# Patient Record
Sex: Female | Born: 1960 | Race: White | Hispanic: Yes | Smoking: Current every day smoker
Health system: Southern US, Community
[De-identification: ages and names within clinical notes are randomized; demographics above are authoritative.]

## PROBLEM LIST (undated history)

## (undated) DIAGNOSIS — Z789 Other specified health status: Secondary | ICD-10-CM

## (undated) DIAGNOSIS — K8010 Calculus of gallbladder with chronic cholecystitis without obstruction: Secondary | ICD-10-CM

## (undated) HISTORY — PX: TUBAL LIGATION: SHX77

## (undated) HISTORY — PX: KNEE SURGERY: SHX244

---

## 2015-02-27 ENCOUNTER — Encounter (HOSPITAL_COMMUNITY): Payer: Self-pay

## 2015-02-27 ENCOUNTER — Emergency Department (HOSPITAL_COMMUNITY): Payer: Self-pay

## 2015-02-27 ENCOUNTER — Emergency Department (HOSPITAL_COMMUNITY)
Admission: EM | Admit: 2015-02-27 | Discharge: 2015-02-27 | Disposition: A | Discharge status: Home / Self Care | Payer: Self-pay | Attending: Physician Assistant | Admitting: Physician Assistant

## 2015-02-27 DIAGNOSIS — K819 Cholecystitis, unspecified: Secondary | ICD-10-CM | POA: Insufficient documentation

## 2015-02-27 DIAGNOSIS — Z3202 Encounter for pregnancy test, result negative: Secondary | ICD-10-CM | POA: Insufficient documentation

## 2015-02-27 DIAGNOSIS — N939 Abnormal uterine and vaginal bleeding, unspecified: Secondary | ICD-10-CM | POA: Insufficient documentation

## 2015-02-27 DIAGNOSIS — F17200 Nicotine dependence, unspecified, uncomplicated: Secondary | ICD-10-CM | POA: Insufficient documentation

## 2015-02-27 LAB — COMPREHENSIVE METABOLIC PANEL
ALK PHOS: 74 U/L (ref 38–126)
ALT: 21 U/L (ref 14–54)
AST: 23 U/L (ref 15–41)
Albumin: 3.9 g/dL (ref 3.5–5.0)
Anion gap: 8 (ref 5–15)
BUN: 9 mg/dL (ref 6–20)
CALCIUM: 9.2 mg/dL (ref 8.9–10.3)
CHLORIDE: 105 mmol/L (ref 101–111)
CO2: 24 mmol/L (ref 22–32)
CREATININE: 0.54 mg/dL (ref 0.44–1.00)
Glucose, Bld: 132 mg/dL — ABNORMAL HIGH (ref 65–99)
Potassium: 3.8 mmol/L (ref 3.5–5.1)
Sodium: 137 mmol/L (ref 135–145)
Total Bilirubin: 0.4 mg/dL (ref 0.3–1.2)
Total Protein: 7.2 g/dL (ref 6.5–8.1)

## 2015-02-27 LAB — URINALYSIS, ROUTINE W REFLEX MICROSCOPIC
BILIRUBIN URINE: NEGATIVE
Glucose, UA: NEGATIVE mg/dL
Ketones, ur: NEGATIVE mg/dL
Nitrite: NEGATIVE
PROTEIN: NEGATIVE mg/dL
Specific Gravity, Urine: 1.009 (ref 1.005–1.030)
pH: 6 (ref 5.0–8.0)

## 2015-02-27 LAB — URINE MICROSCOPIC-ADD ON

## 2015-02-27 LAB — CBC WITH DIFFERENTIAL/PLATELET
Basophils Absolute: 0 10*3/uL (ref 0.0–0.1)
Basophils Relative: 0 %
EOS PCT: 1 %
Eosinophils Absolute: 0 10*3/uL (ref 0.0–0.7)
HCT: 38.5 % (ref 36.0–46.0)
Hemoglobin: 13.3 g/dL (ref 12.0–15.0)
LYMPHS ABS: 1.8 10*3/uL (ref 0.7–4.0)
LYMPHS PCT: 29 %
MCH: 29.5 pg (ref 26.0–34.0)
MCHC: 34.5 g/dL (ref 30.0–36.0)
MCV: 85.4 fL (ref 78.0–100.0)
MONO ABS: 0.8 10*3/uL (ref 0.1–1.0)
MONOS PCT: 12 %
Neutro Abs: 3.8 10*3/uL (ref 1.7–7.7)
Neutrophils Relative %: 58 %
PLATELETS: 203 10*3/uL (ref 150–400)
RBC: 4.51 MIL/uL (ref 3.87–5.11)
RDW: 12.6 % (ref 11.5–15.5)
WBC: 6.5 10*3/uL (ref 4.0–10.5)

## 2015-02-27 LAB — LIPASE, BLOOD: LIPASE: 29 U/L (ref 11–51)

## 2015-02-27 LAB — POC URINE PREG, ED: PREG TEST UR: NEGATIVE

## 2015-02-27 MED ORDER — ONDANSETRON HCL 4 MG/2ML IJ SOLN
4.0000 mg | Freq: Once | INTRAMUSCULAR | Status: AC
  Administered 2015-02-27: 4 mg via INTRAVENOUS
  Filled 2015-02-27: qty 2

## 2015-02-27 MED ORDER — SODIUM CHLORIDE 0.9 % IV BOLUS (SEPSIS)
1000.0000 mL | Freq: Once | INTRAVENOUS | Status: AC
  Administered 2015-02-27: 1000 mL via INTRAVENOUS

## 2015-02-27 MED ORDER — OXYCODONE-ACETAMINOPHEN 5-325 MG PO TABS: 2.0000 | ORAL_TABLET | ORAL | Status: DC | PRN

## 2015-02-27 MED ORDER — MORPHINE SULFATE (PF) 4 MG/ML IV SOLN
4.0000 mg | Freq: Once | INTRAVENOUS | Status: AC
  Administered 2015-02-27: 4 mg via INTRAVENOUS
  Filled 2015-02-27: qty 1

## 2015-02-27 MED ORDER — ONDANSETRON HCL 4 MG PO TABS: 4.0000 mg | ORAL_TABLET | Freq: Four times a day (QID) | ORAL | Status: AC

## 2015-02-27 NOTE — ED Provider Notes (Signed)
CSN: [id number]     Arrival date & time 02/27/15  1053 History   First MD Initiated Contact with Patient 02/27/15 1133     Chief Complaint  Patient presents with  . Flank Pain   (Consider location/radiation/quality/duration/timing/severity/associated sxs/prior Treatment) Patient is a 54 y.o. female presenting with flank pain. The history is provided by the patient and a relative. No language interpreter was used.  Flank Pain Associated symptoms include abdominal pain, nausea and vomiting. Pertinent negatives include no fever.  [name] is a 54 year old female with no significant past medical history who presents complaining of right flank pain and right upper quadrant abdominal pain since yesterday. She is also reporting decreased urine output for the past week. She vomited 4 times yesterday and once this morning. She has had normal bowel movements and her last BM was yesterday. She denies any previous abdominal surgeries. She also reports that she is on her period for over a month. [place] the country in one [place] ago. She has no established PCP. She felt feverish yesterday but did not take her temperature. She denies any dizziness, chills, chest pain, shortness of breath, diarrhea, constipation.    History reviewed. No pertinent past medical history. Past Surgical History  Procedure Laterality Date  . Knee surgery     No family history on file. Social History  Substance Use Topics  . Smoking status: Current Every Day Smoker  . Smokeless tobacco: None  . Alcohol Use: No   OB History    No data available     Review of Systems  Constitutional: Negative for fever.  Gastrointestinal: Positive for nausea, vomiting and abdominal pain. Negative for diarrhea, constipation and blood in stool.  Genitourinary: Positive for flank pain.  All other systems reviewed and are negative.     Allergies  Review of patient's allergies indicates no known allergies.  Home Medications    Prior to Admission medications   Medication Sig Start Date End Date Taking? Authorizing Provider  ibuprofen (ADVIL,MOTRIN) 200 MG tablet Take 400 mg by mouth every 6 (six) hours as needed for headache, mild pain or moderate pain.   Yes Historical Provider, MD  ondansetron (ZOFRAN) 4 MG tablet Take 1 tablet (4 mg total) by mouth every 6 (six) hours. 02/27/15   Hillery Bhalla Patel-Mills, PA-C  oxyCODONE-acetaminophen (PERCOCET/ROXICET) 5-325 MG tablet Take 2 tablets by mouth every 4 (four) hours as needed for severe pain. 02/27/15   Doyle Kunath Patel-Mills, PA-C   BP 116/89 mmHg  Pulse 75  Temp(Src) 98.7 F (37.1 C) (Oral)  Resp 16  SpO2 98%  LMP 01/28/2015 (Exact Date) Physical Exam  Constitutional: She is oriented to person, place, and time. She appears well-developed and well-nourished.  HENT:  Head: Normocephalic and atraumatic.  Eyes: Conjunctivae are normal.  No jaundice.  Neck: Normal range of motion. Neck supple.  Cardiovascular: Normal rate, regular rhythm and normal heart sounds.   Pulmonary/Chest: Effort normal and breath sounds normal. No respiratory distress.  Abdominal: Soft. She exhibits no distension. There is tenderness in the right upper quadrant. There is CVA tenderness. There is no rebound and no guarding.    Right flank and right CVA tenderness to palpation. No guarding or rebound. No abdominal distention.  Genitourinary:  Pelvic exam: Chaperone present, moderate amount of vaginal bleeding from the cervical os. No adnexal tenderness.  Musculoskeletal: Normal range of motion.  Neurological: She is alert and oriented to person, place, and time.  Skin: Skin is warm and dry.  Nursing  note and vitals reviewed.   ED Course  Procedures (including critical care time) Labs Review Labs Reviewed  COMPREHENSIVE METABOLIC PANEL - Abnormal; Notable for the following:    Glucose, Bld 132 (*)    All other components within normal limits  URINALYSIS, ROUTINE W REFLEX MICROSCOPIC (NOT  AT [facility]) - Abnormal; Notable for the following:    APPearance CLOUDY (*)    Hgb urine dipstick LARGE (*)    Leukocytes, UA SMALL (*)    All other components within normal limits  URINE MICROSCOPIC-ADD ON - Abnormal; Notable for the following:    Squamous Epithelial / LPF 0-5 (*)    Bacteria, UA FEW (*)    All other components within normal limits  CBC WITH DIFFERENTIAL/PLATELET  LIPASE, BLOOD  POC URINE PREG, ED    Imaging Review US Abdomen Complete  02/27/2015  CLINICAL DATA:  54 year old female with right abdominal and flank pain EXAM: ABDOMEN ULTRASOUND COMPLETE COMPARISON:  None. FINDINGS: Gallbladder: Echogenic mobile foci with posterior acoustic shadowing consistent with cholelithiasis. The gallbladder wall is thickened at 6 mm. Per the sonographer, there is no sonographic Murphy sign. Common bile duct: Diameter: Within normal limits at 5 mm Liver: No focal lesion identified. Within normal limits in parenchymal echogenicity. IVC: No abnormality visualized. Pancreas: Visualized portion unremarkable. Spleen: Size and appearance within normal limits. Right Kidney: Length: 10.3 cm. Echogenicity within normal limits. No mass or hydronephrosis visualized. Left Kidney: Length: 11.2 cm. Echogenicity within normal limits. No mass or hydronephrosis visualized. Abdominal aorta: No aneurysm visualized. Other findings: None. IMPRESSION: 1. Cholelithiasis with diffuse gallbladder wall thickening. However, there is no pericholecystic fluid and per the sonographer, the sonographic Murphy sign was negative. Sonographic findings are therefore indeterminate but can be seen in the setting of both chronic and acute cholecystitis. If further imaging is clinically warranted, consider nuclear medicine HIDA scan. 2. Normal caliber common bile ducts. Electronically Signed   By: Malachy Moan M.D.   On: 02/27/2015 14:56   I have personally reviewed and evaluated these images and lab results as part of my medical  decision-making.   EKG Interpretation None      MDM   Final diagnoses:  Cholecystitis  Vaginal bleeding  Patient presents for RUQ abdominal pain and decreased urine output.  She also complains of being on her menstrual cycle for a month. Her exam shows moderate amount of vaginal bleeding but her hemoglobin is stable. She is well-appearing and has no complaints of dizziness or shortness of breath. He was given women's outpatient clinic for follow-up for vaginal bleeding. He was able to urinate in the ED without difficulty. She does not have a urinary tract infection and is not pregnant. Her labs are unremarkable. Due to right upper quadrant abdominal pain a ultrasound was ordered. Ultrasound shows acute cholelithiasis but no gallbladder wall thickening or dilation of common bile duct. I do not believe this is acute cholangitis the patient is afebrile and without tenderness. I discussed findings with her son. I also explained it would need to follow-up with Gen. surgery. Return precautions were discussed. She was prescribed Zofran and Percocet. Patient and son and agreeable to plan.  Medications  sodium chloride 0.9 % bolus 1,000 mL (0 mLs Intravenous Stopped 02/27/15 1401)  morphine 4 MG/ML injection 4 mg (4 mg Intravenous Given 02/27/15 1233)  ondansetron (ZOFRAN) injection 4 mg (4 mg Intravenous Given 02/27/15 1233)   Filed Vitals:   02/27/15 1121 02/27/15 1532  BP: 110/59 116/89  Pulse: 75  75  Temp: 98.1 F (36.7 C) 98.7 F (37.1 C)  Resp: 16 [id number]      Tequia Wolman Patel-Mills, PA-C 02/28/15 1143  Courteney Randall AnLyn Mackuen, MD 02/28/15 1516

## 2015-02-27 NOTE — ED Notes (Signed)
Ultrasound Bedside.

## 2015-02-27 NOTE — ED Notes (Signed)
She c/o  Right flank pain with urinary hesitancy x 1 week.  It got worse yesterday and she vomited a few times; and also vomited x 1 this morning.  She is in no distress.

## 2015-03-18 ENCOUNTER — Other Ambulatory Visit: Payer: Self-pay | Admitting: General Surgery

## 2015-04-29 ENCOUNTER — Encounter (HOSPITAL_COMMUNITY)
Admission: RE | Admit: 2015-04-29 | Discharge: 2015-04-29 | Disposition: A | Discharge status: Home / Self Care | Payer: Self-pay | Source: Ambulatory Visit | Attending: General Surgery | Admitting: General Surgery

## 2015-04-29 ENCOUNTER — Encounter (HOSPITAL_COMMUNITY): Payer: Self-pay

## 2015-04-29 DIAGNOSIS — K8010 Calculus of gallbladder with chronic cholecystitis without obstruction: Secondary | ICD-10-CM | POA: Insufficient documentation

## 2015-04-29 DIAGNOSIS — Z01812 Encounter for preprocedural laboratory examination: Secondary | ICD-10-CM | POA: Insufficient documentation

## 2015-04-29 HISTORY — DX: Other specified health status: Z78.9

## 2015-04-29 LAB — CBC WITH DIFFERENTIAL/PLATELET
BASOS PCT: 0 %
Basophils Absolute: 0 10*3/uL (ref 0.0–0.1)
EOS ABS: 0.3 10*3/uL (ref 0.0–0.7)
EOS PCT: 4 %
HCT: 41.5 % (ref 36.0–46.0)
HEMOGLOBIN: 14.1 g/dL (ref 12.0–15.0)
LYMPHS ABS: 3.3 10*3/uL (ref 0.7–4.0)
Lymphocytes Relative: 43 %
MCH: 29.6 pg (ref 26.0–34.0)
MCHC: 34 g/dL (ref 30.0–36.0)
MCV: 87.2 fL (ref 78.0–100.0)
Monocytes Absolute: 0.5 10*3/uL (ref 0.1–1.0)
Monocytes Relative: 7 %
NEUTROS PCT: 46 %
Neutro Abs: 3.6 10*3/uL (ref 1.7–7.7)
PLATELETS: 225 10*3/uL (ref 150–400)
RBC: 4.76 MIL/uL (ref 3.87–5.11)
RDW: 12.9 % (ref 11.5–15.5)
WBC: 7.8 10*3/uL (ref 4.0–10.5)

## 2015-04-29 LAB — COMPREHENSIVE METABOLIC PANEL
ALBUMIN: 4 g/dL (ref 3.5–5.0)
ALK PHOS: 89 U/L (ref 38–126)
ALT: 19 U/L (ref 14–54)
ANION GAP: 14 (ref 5–15)
AST: 16 U/L (ref 15–41)
BUN: 18 mg/dL (ref 6–20)
CALCIUM: 9.7 mg/dL (ref 8.9–10.3)
CHLORIDE: 106 mmol/L (ref 101–111)
CO2: 21 mmol/L — AB (ref 22–32)
CREATININE: 0.53 mg/dL (ref 0.44–1.00)
GFR calc Af Amer: >60 mL/min (ref >60)
GFR calc non Af Amer: >60 mL/min (ref >60)
GLUCOSE: 106 mg/dL — AB (ref 65–99)
Potassium: 4.3 mmol/L (ref 3.5–5.1)
SODIUM: 141 mmol/L (ref 135–145)
Total Bilirubin: 0.1 mg/dL — ABNORMAL LOW (ref 0.3–1.2)
Total Protein: 6.9 g/dL (ref 6.5–8.1)

## 2015-04-29 NOTE — Pre-Procedure Instructions (Signed)
    Freddy Spadafora  04/29/2015      [facility] [id number] [address],  - [address] AT [facility] OF [facility] & [address] [place] [address] [place] [address] Phone: [phone] Fax: [phone]    Your procedure is scheduled on 05/04/15.  Report to [facility] Admitting at 530 A.M.  Call this number if you have problems the morning of surgery:  [phone]   Remember:  Do not eat food or drink liquids after midnight.  Take these medicines the morning of surgery with A SIP OF WATER --pain medication   Do not wear jewelry, make-up or nail polish.  Do not wear lotions, powders, or perfumes.  You may wear deodorant.  Do not shave 48 hours prior to surgery.  Men may shave face and neck.  Do not bring valuables to the hospital.  [facility] is not responsible for any belongings or valuables.  Contacts, dentures or bridgework may not be worn into surgery.  Leave your suitcase in the car.  After surgery it may be brought to your room.  For patients admitted to the hospital, discharge time will be determined by your treatment team.  Patients discharged the day of surgery will not be allowed to drive home.   Name and phone number of your driver:    Special instructions:   Please read over the following fact sheets that you were given. Pain Booklet, Coughing and Deep Breathing and Surgical Site Infection Prevention

## 2015-05-03 MED ORDER — CEFAZOLIN SODIUM-DEXTROSE 2-3 GM-% IV SOLR
2.0000 g | INTRAVENOUS | Status: AC
  Administered 2015-05-04: 2 g via INTRAVENOUS
  Filled 2015-05-03: qty 50

## 2015-05-03 NOTE — H&P (Signed)
Katherine Warren   Location: [facility] Surgery Patient #: [id number] DOB: 07/07/1960 Married / Language: English / Race: White Female      History of Present Illness   The patient is a 55 year old female who presents for evaluation of gall stones. This is a very pleasant 55 year old Hispanic woman, referred by Dr. Thomasene Warren in the emergency department for evaluation of symptomatic gallstones.  The patient is here with her daughter who provides fluent translation. The daughter is present throughout the entire encounter. The patient is in no distress today. She gives a one-year history of episodes of right upper quadrant and right flank pain nausea and vomiting. This is intermittent. Denies history of pancreatitis, jaundice, or hepatitis. She had a severe attack before Christmas. She presented to the emergency department with 3 days of right upper quadrant pain. CBC, C met, and lipase were normal. Ultrasound showed multiple gallstones, diffuse gallbladder wall thickening but no other inflammatory changes and a negative Murphy's sign. CBD is normal.  She's feeling fairly well today. Has a little bit of discomfort off and on. Has not vomited since Christmas Eve.  Past history is negative for any abdominal surgery except for a laparoscopic tubal. Family history is negative for gallbladder disease or cancer.  She moved to this country 2 months ago. She is married and her husband works Architect. She lives at home and is unemployed. She has 5 children. Smokes at least a half a pack of cigarettes per day. Denies alcohol.  I went over the Spanish language information booklet with her and gave it to her. She understands the need for surgery. She'll be scheduled for laparoscopic cholecystectomy with cholangiogram, possible open. It was explained to her and she understands that there is a risk of bleeding, infection, conversion to open laparotomy, wound healing problems such as  hernia, bile leak necessitating reoperation, injury to adjacent organs requiring major reconstructive surgery, common bile duct stones requiring further interventions, cardiac pulmonary and thromboembolic problems. She understands all these issues well. All questions are answered. She agrees with this plan.   Anxiety Disorder Back Pain Cholelithiasis Hypercholesterolemia  Past Surgical History  Knee Surgery Right.  Diagnostic Studies History  Mammogram 1-3 years ago Pap Smear 1-5 years ago   Allergies  No Known Dbin Gwynn, RMA; 03/18/2015 1:44 PM)rug Allergies01/02/2016  Medication History  Oxycodone-Acetaminophen (5-325MG Tablet, Oral) Active. Ondansetron HCl (4MG Tablet, Oral) Active.  Alcohol use Remotely quit alcohol use. Caffeine use Carbonated beverages, Coffee. No drug use Tobacco use Current every day smoker.  Family History Alcohol Abuse Brother, Daughter, Sister. Diabetes Mellitus Father, Sister. Kidney Disease Daughter. Migraine Headache Daughter.  Pregnancy / Birth History  Age at menarche 28 years. Gravida 5 Irregular periods Maternal age 1-20 Para 5    Review of Systems  General Not Present- Appetite Loss, Chills, Fatigue, Fever, Night Sweats, Weight Gain and Weight Loss. Skin Not Present- Change in Wart/Mole, Dryness, Hives, Jaundice, New Lesions, Non-Healing Wounds, Rash and Ulcer. HEENT Present- Hearing Loss, Ringing in the Ears, Seasonal Allergies, Visual Disturbances and Wears glasses/contact lenses. Not Present- Earache, Hoarseness, Nose Bleed, Oral Ulcers, Sinus Pain, Sore Throat and Yellow Eyes. Breast Present- Breast Pain. Not Present- Breast Mass, Nipple Discharge and Skin Changes. Cardiovascular Present- Leg Cramps and Swelling of Extremities. Not Present- Chest Pain, Difficulty Breathing Lying Down, Palpitations, Rapid Heart Rate and Shortness of Breath. Gastrointestinal Present- Abdominal Pain, Nausea and Vomiting.  Not Present- Bloating, Bloody Stool, Change in Bowel Habits, Chronic diarrhea, Constipation, Difficulty Swallowing,  Excessive gas, Gets full quickly at meals, Hemorrhoids, Indigestion and Rectal Pain. Musculoskeletal Present- Back Pain, Muscle Pain and Swelling of Extremities. Not Present- Joint Pain, Joint Stiffness and Muscle Weakness. Neurological Present- Decreased Memory, Headaches and Numbness. Not Present- Fainting, Seizures, Tingling, Tremor, Trouble walking and Weakness. Psychiatric Present- Anxiety, Bipolar, Change in Sleep Pattern and Fearful. Not Present- Depression and Frequent crying. Endocrine Present- Excessive Hunger and Heat Intolerance. Not Present- Cold Intolerance, Hair Changes, Hot flashes and New Diabetes. Hematology Present- Excessive bleeding. Not Present- Easy Bruising, Gland problems, HIV and Persistent Infections.  Vitals Weight: 165 lb Height: 60in Body Surface Area: 1.72 m Body Mass Index: 32.22 kg/m  Temp.: 97.44F  Pulse: 81 (Regular)  BP: 124/80 (Sitting, Left Arm, Standard)       History and physical General Mental Status-Alert. General Appearance-Consistent with stated age. Hydration-Well hydrated. Voice-Normal. Note: BMI 32.2   Head and Neck Head-normocephalic, atraumatic with no lesions or palpable masses. Trachea-midline. Thyroid Gland Characteristics - normal size and consistency.  Eye Eyeball - Bilateral-Extraocular movements intact. Sclera/Conjunctiva - Bilateral-No scleral icterus.  Chest and Lung Exam Chest and lung exam reveals -quiet, even and easy respiratory effort with no use of accessory muscles and on auscultation, normal breath sounds, no adventitious sounds and normal vocal resonance. Inspection Chest Wall - Normal. Back - normal.  Cardiovascular Cardiovascular examination reveals -normal heart sounds, regular rate and rhythm with no murmurs and normal pedal pulses  bilaterally.  Abdomen Note: Abdomen is soft. Not distended. Subjectively a little tender in the right upper quadrant but no guarding or mass. Small scar at the umbilicus. No hernias. No enlargement of liver or spleen.   Neurologic Neurologic evaluation reveals -alert and oriented x 3 with no impairment of recent or remote memory. Mental Status-Normal.  Musculoskeletal Normal Exam - Left-Upper Extremity Strength Normal and Lower Extremity Strength Normal. Normal Exam - Right-Upper Extremity Strength Normal and Lower Extremity Strength Normal.  Lymphatic Head & Neck  General Head & Neck Lymphatics: Bilateral - Description - Normal. Axillary  General Axillary Region: Bilateral - Description - Normal. Tenderness - Non Tender. Femoral & Inguinal  Generalized Femoral & Inguinal Lymphatics: Bilateral - Description - Normal. Tenderness - Non Tender.    Assessment & Plan  CHRONIC CHOLECYSTITIS WITH CALCULUS (K80.10) .   Your episodes of abdominal pain nausea and vomiting or due to your multiple gallstones. This pain episodes will continue until you have a gallbladder operation. You have stated that she would like to have this operation. We have discussed the indications, techniques, and numerous risk of this surgery in detail please read the patient information booklet in Spanish that we gave the  Avoid all fatty foods and also spicy foods in the meantime. Try hard to cut back on your cigarette smoking  BMI 32.0-32.9,ADULT (Z68.32) HISTORY OF TUBAL LIGATION (Z98.51) TOBACCO ABUSE (Z72.0)    Sareena Odeh M. Dalbert Batman, M.D., [facility] General and Minimally invasive Surgery Breast and Colorectal Surgery Office:   [phone] Pager:   [phone]

## 2015-05-04 ENCOUNTER — Ambulatory Visit (HOSPITAL_COMMUNITY)
Admission: RE | Admit: 2015-05-04 | Discharge: 2015-05-04 | Disposition: A | Discharge status: Home / Self Care | Payer: Self-pay | Source: Ambulatory Visit | Attending: General Surgery | Admitting: General Surgery

## 2015-05-04 ENCOUNTER — Ambulatory Visit (HOSPITAL_COMMUNITY): Payer: Self-pay | Admitting: Anesthesiology

## 2015-05-04 ENCOUNTER — Encounter (HOSPITAL_COMMUNITY)
Admission: RE | Disposition: A | Discharge status: Home / Self Care | Payer: Self-pay | Source: Ambulatory Visit | Attending: General Surgery

## 2015-05-04 ENCOUNTER — Encounter (HOSPITAL_COMMUNITY): Payer: Self-pay | Admitting: *Deleted

## 2015-05-04 DIAGNOSIS — F17210 Nicotine dependence, cigarettes, uncomplicated: Secondary | ICD-10-CM | POA: Insufficient documentation

## 2015-05-04 DIAGNOSIS — K8010 Calculus of gallbladder with chronic cholecystitis without obstruction: Secondary | ICD-10-CM

## 2015-05-04 DIAGNOSIS — F419 Anxiety disorder, unspecified: Secondary | ICD-10-CM | POA: Insufficient documentation

## 2015-05-04 DIAGNOSIS — Z6832 Body mass index (BMI) 32.0-32.9, adult: Secondary | ICD-10-CM | POA: Insufficient documentation

## 2015-05-04 DIAGNOSIS — E669 Obesity, unspecified: Secondary | ICD-10-CM | POA: Insufficient documentation

## 2015-05-04 DIAGNOSIS — E7800 Pure hypercholesterolemia, unspecified: Secondary | ICD-10-CM | POA: Insufficient documentation

## 2015-05-04 HISTORY — DX: Calculus of gallbladder with chronic cholecystitis without obstruction: K80.10

## 2015-05-04 HISTORY — PX: CHOLECYSTECTOMY: SHX55

## 2015-05-04 SURGERY — LAPAROSCOPIC CHOLECYSTECTOMY WITH INTRAOPERATIVE CHOLANGIOGRAM
Anesthesia: General

## 2015-05-04 MED ORDER — NEOSTIGMINE METHYLSULFATE 10 MG/10ML IV SOLN
INTRAVENOUS | Status: DC | PRN
  Administered 2015-05-04: 4 mg via INTRAVENOUS

## 2015-05-04 MED ORDER — GLYCOPYRROLATE 0.2 MG/ML IJ SOLN
INTRAMUSCULAR | Status: DC | PRN
  Administered 2015-05-04: 0.6 mg via INTRAVENOUS

## 2015-05-04 MED ORDER — PROPOFOL 10 MG/ML IV BOLUS
INTRAVENOUS | Status: DC | PRN
  Administered 2015-05-04: 150 mg via INTRAVENOUS

## 2015-05-04 MED ORDER — BUPIVACAINE-EPINEPHRINE 0.5% -1:200000 IJ SOLN
INTRAMUSCULAR | Status: DC | PRN
Start: 2015-05-04 — End: 2015-05-04
  Administered 2015-05-04: 9 mL

## 2015-05-04 MED ORDER — MIDAZOLAM HCL 2 MG/2ML IJ SOLN
INTRAMUSCULAR | Status: AC
  Filled 2015-05-04: qty 2

## 2015-05-04 MED ORDER — ONDANSETRON HCL 4 MG/2ML IJ SOLN
INTRAMUSCULAR | Status: DC | PRN
  Administered 2015-05-04: 4 mg via INTRAVENOUS

## 2015-05-04 MED ORDER — FENTANYL CITRATE (PF) 250 MCG/5ML IJ SOLN
INTRAMUSCULAR | Status: AC
  Filled 2015-05-04: qty 5

## 2015-05-04 MED ORDER — LIDOCAINE HCL (CARDIAC) 20 MG/ML IV SOLN
INTRAVENOUS | Status: DC | PRN
  Administered 2015-05-04: 60 mg via INTRAVENOUS

## 2015-05-04 MED ORDER — FENTANYL CITRATE (PF) 100 MCG/2ML IJ SOLN
INTRAMUSCULAR | Status: DC | PRN
  Administered 2015-05-04: 100 ug via INTRAVENOUS
  Administered 2015-05-04 (×2): 50 ug via INTRAVENOUS

## 2015-05-04 MED ORDER — METOCLOPRAMIDE HCL 5 MG/ML IJ SOLN
10.0000 mg | Freq: Once | INTRAMUSCULAR | Status: DC | PRN
Start: 2015-05-04 — End: 2015-05-04

## 2015-05-04 MED ORDER — PROPOFOL 10 MG/ML IV BOLUS
INTRAVENOUS | Status: AC
  Filled 2015-05-04: qty 40

## 2015-05-04 MED ORDER — 0.9 % SODIUM CHLORIDE (POUR BTL) OPTIME
TOPICAL | Status: DC | PRN
  Administered 2015-05-04: 1000 mL

## 2015-05-04 MED ORDER — HYDROMORPHONE HCL 1 MG/ML IJ SOLN
INTRAMUSCULAR | Status: AC
  Administered 2015-05-04: 0.5 mg via INTRAVENOUS
  Filled 2015-05-04: qty 1

## 2015-05-04 MED ORDER — MIDAZOLAM HCL 5 MG/5ML IJ SOLN
INTRAMUSCULAR | Status: DC | PRN
  Administered 2015-05-04: 2 mg via INTRAVENOUS

## 2015-05-04 MED ORDER — LACTATED RINGERS IV SOLN
INTRAVENOUS | Status: DC | PRN
  Administered 2015-05-04 (×2): via INTRAVENOUS

## 2015-05-04 MED ORDER — ACETAMINOPHEN 10 MG/ML IV SOLN
INTRAVENOUS | Status: DC | PRN
  Administered 2015-05-04: 1000 mg via INTRAVENOUS

## 2015-05-04 MED ORDER — SODIUM CHLORIDE 0.9 % IR SOLN
Status: DC | PRN
  Administered 2015-05-04: 1000 mL

## 2015-05-04 MED ORDER — ROCURONIUM BROMIDE 100 MG/10ML IV SOLN
INTRAVENOUS | Status: DC | PRN
  Administered 2015-05-04: 40 mg via INTRAVENOUS

## 2015-05-04 MED ORDER — HYDROMORPHONE HCL 1 MG/ML IJ SOLN
0.2500 mg | INTRAMUSCULAR | Status: DC | PRN
  Administered 2015-05-04 (×4): 0.5 mg via INTRAVENOUS

## 2015-05-04 MED ORDER — CHLORHEXIDINE GLUCONATE 4 % EX LIQD: 1.0000 "application " | Freq: Once | CUTANEOUS | Status: DC

## 2015-05-04 MED ORDER — BUPIVACAINE-EPINEPHRINE (PF) 0.5% -1:200000 IJ SOLN
INTRAMUSCULAR | Status: AC
  Filled 2015-05-04: qty 30

## 2015-05-04 MED ORDER — MEPERIDINE HCL 25 MG/ML IJ SOLN: 6.2500 mg | INTRAMUSCULAR | Status: DC | PRN

## 2015-05-04 MED ORDER — HYDROCODONE-ACETAMINOPHEN 5-325 MG PO TABS: 1.0000 | ORAL_TABLET | Freq: Four times a day (QID) | ORAL | Status: AC | PRN

## 2015-05-04 MED ORDER — SCOPOLAMINE 1 MG/3DAYS TD PT72
MEDICATED_PATCH | TRANSDERMAL | Status: AC
  Filled 2015-05-04: qty 1

## 2015-05-04 MED ORDER — ACETAMINOPHEN 10 MG/ML IV SOLN
INTRAVENOUS | Status: AC
  Filled 2015-05-04: qty 100

## 2015-05-04 SURGICAL SUPPLY — 49 items
ADH SKN CLS APL DERMABOND .7 (GAUZE/BANDAGES/DRESSINGS) ×1
ADH SKN CLS LQ APL DERMABOND (GAUZE/BANDAGES/DRESSINGS) ×1
APL SKNCLS STERI-STRIP NONHPOA (GAUZE/BANDAGES/DRESSINGS) ×1
APPLIER CLIP ROT 10 11.4 M/L (STAPLE)
APR CLP MED LRG 11.4X10 (STAPLE)
BAG SPEC RTRVL LRG 6X4 10 (ENDOMECHANICALS) ×1
BENZOIN TINCTURE PRP APPL 2/3 (GAUZE/BANDAGES/DRESSINGS) ×2 IMPLANT
BLADE SURG ROTATE 9660 (MISCELLANEOUS) IMPLANT
CANISTER SUCTION 2500CC (MISCELLANEOUS) ×3 IMPLANT
CHLORAPREP W/TINT 26ML (MISCELLANEOUS) ×5 IMPLANT
CLIP APPLIE ROT 10 11.4 M/L (STAPLE) ×1 IMPLANT
CLOSURE STERI-STRIP 1/2X4 (GAUZE/BANDAGES/DRESSINGS) ×1
CLSR STERI-STRIP ANTIMIC 1/2X4 (GAUZE/BANDAGES/DRESSINGS) ×1 IMPLANT
COVER MAYO STAND STRL (DRAPES) ×3 IMPLANT
COVER SURGICAL LIGHT HANDLE (MISCELLANEOUS) ×3 IMPLANT
DERMABOND ADHESIVE PROPEN (GAUZE/BANDAGES/DRESSINGS) ×2
DERMABOND ADVANCED (GAUZE/BANDAGES/DRESSINGS) ×2
DERMABOND ADVANCED .7 DNX12 (GAUZE/BANDAGES/DRESSINGS) ×1 IMPLANT
DERMABOND ADVANCED .7 DNX6 (GAUZE/BANDAGES/DRESSINGS) IMPLANT
DRAPE C-ARM 42X72 X-RAY (DRAPES) ×1 IMPLANT
ELECT REM PT RETURN 9FT ADLT (ELECTROSURGICAL) ×3
ELECTRODE REM PT RTRN 9FT ADLT (ELECTROSURGICAL) ×1 IMPLANT
GLOVE BIOGEL PI IND STRL 6.5 (GLOVE) IMPLANT
GLOVE BIOGEL PI INDICATOR 6.5 (GLOVE) ×2
GLOVE EUDERMIC 7 POWDERFREE (GLOVE) ×3 IMPLANT
GLOVE SURG SS PI 6.5 STRL IVOR (GLOVE) ×2 IMPLANT
GOWN STRL REUS W/ TWL LRG LVL3 (GOWN DISPOSABLE) ×2 IMPLANT
GOWN STRL REUS W/ TWL XL LVL3 (GOWN DISPOSABLE) ×1 IMPLANT
GOWN STRL REUS W/TWL LRG LVL3 (GOWN DISPOSABLE) ×6
GOWN STRL REUS W/TWL XL LVL3 (GOWN DISPOSABLE) ×3
KIT BASIN OR (CUSTOM PROCEDURE TRAY) ×3 IMPLANT
KIT ROOM TURNOVER OR (KITS) ×3 IMPLANT
NS IRRIG 1000ML POUR BTL (IV SOLUTION) ×3 IMPLANT
PAD ARMBOARD 7.5X6 YLW CONV (MISCELLANEOUS) ×3 IMPLANT
POUCH SPECIMEN RETRIEVAL 10MM (ENDOMECHANICALS) ×3 IMPLANT
SCISSORS LAP 5X35 DISP (ENDOMECHANICALS) ×3 IMPLANT
SET CHOLANGIOGRAPH 5 50 .035 (SET/KITS/TRAYS/PACK) ×3 IMPLANT
SET IRRIG TUBING LAPAROSCOPIC (IRRIGATION / IRRIGATOR) ×3 IMPLANT
SLEEVE ENDOPATH XCEL 5M (ENDOMECHANICALS) ×3 IMPLANT
SPECIMEN JAR SMALL (MISCELLANEOUS) ×3 IMPLANT
SUT MNCRL AB 4-0 PS2 18 (SUTURE) ×5 IMPLANT
SUT VICRYL 0 UR6 27IN ABS (SUTURE) ×3 IMPLANT
TOWEL OR 17X24 6PK STRL BLUE (TOWEL DISPOSABLE) ×3 IMPLANT
TOWEL OR 17X26 10 PK STRL BLUE (TOWEL DISPOSABLE) ×3 IMPLANT
TRAY LAPAROSCOPIC MC (CUSTOM PROCEDURE TRAY) ×3 IMPLANT
TROCAR XCEL BLUNT TIP 100MML (ENDOMECHANICALS) ×3 IMPLANT
TROCAR XCEL NON-BLD 11X100MML (ENDOMECHANICALS) ×3 IMPLANT
TROCAR XCEL NON-BLD 5MMX100MML (ENDOMECHANICALS) ×3 IMPLANT
TUBING INSUFFLATION (TUBING) ×3 IMPLANT

## 2015-05-04 NOTE — Anesthesia Procedure Notes (Signed)
Procedure Name: Intubation Date/Time: 05/04/2015 7:37 AM Performed by: Fransisca Kaufmann Pre-anesthesia Checklist: Patient identified, Emergency Drugs available, Suction available, Patient being monitored and Timeout performed Patient Re-evaluated:Patient Re-evaluated prior to inductionOxygen Delivery Method: Circle system utilized Preoxygenation: Pre-oxygenation with 100% oxygen Intubation Type: IV induction Ventilation: Mask ventilation without difficulty Grade View: Grade I Tube type: Oral Tube size: 7.5 mm Number of attempts: 1 Airway Equipment and Method: Stylet Placement Confirmation: ETT inserted through vocal cords under direct vision,  positive ETCO2 and breath sounds checked- equal and bilateral Secured at: 21 cm Dental Injury: Teeth and Oropharynx as per pre-operative assessment  Comments: Crowns intact as pre-induction

## 2015-05-04 NOTE — Anesthesia Postprocedure Evaluation (Signed)
Anesthesia Post Note  Patient: Katherine Warren  Procedure(s) Performed: Procedure(s) (LRB): LAPAROSCOPIC CHOLECYSTECTOMY  (N/A)  Patient location during evaluation: PACU Anesthesia Type: General Level of consciousness: awake and alert and oriented Pain management: pain level controlled Vital Signs Assessment: post-procedure vital signs reviewed and stable Respiratory status: spontaneous breathing, nonlabored ventilation and respiratory function stable Cardiovascular status: blood pressure returned to baseline and stable Postop Assessment: no signs of nausea or vomiting Anesthetic complications: no    Last Vitals:  Filed Vitals:   05/04/15 0930 05/04/15 0945  BP: 121/78 128/71  Pulse: 65 62  Temp:    Resp: 12 19    Last Pain:  Filed Vitals:   05/04/15 0954  PainSc: 5                  Dalynn Jhaveri A.

## 2015-05-04 NOTE — Interval H&P Note (Signed)
History and Physical Interval Note:  05/04/2015 6:35 AM  Katherine Warren  has presented today for surgery, with the diagnosis of gallstones  The various methods of treatment have been discussed with the patient and family. After consideration of risks, benefits and other options for treatment, the patient has consented to  Procedure(s): LAPAROSCOPIC CHOLECYSTECTOMY WITH INTRAOPERATIVE CHOLANGIOGRAM POSSIBLE OPEN (N/A) as a surgical intervention .  The patient's history has been reviewed, patient examined, no change in status, stable for surgery.  I have reviewed the patient's chart and labs.  Questions were answered to the patient's satisfaction.     Ernestene Mention

## 2015-05-04 NOTE — Op Note (Signed)
Patient Name:           Katherine Warren   Date of Surgery:        05/04/2015  Pre op Diagnosis:      Chronic cholecystitis with cholelithiasis  Post op Diagnosis:    Same  Procedure:                 Laparoscopic cholecystectomy  Surgeon:                     Edsel Petrin. Dalbert Batman, M.D., FACS  Assistant:                      Excell Seltzer, M.D., [facility]  Operative Indications:   This is a very pleasant 55 year old Hispanic woman, referred by Dr. Thomasene Lot in the emergency department for evaluation of symptomatic gallstones.     She gives a one-year history of episodes of right upper quadrant and right flank pain nausea and vomiting. This is intermittent. Denies history of pancreatitis, jaundice, or hepatitis. She had a severe attack before Christmas. She presented to the emergency department with 3 days of right upper quadrant pain. CBC, C met, and lipase were normal.  Repeat liver function test preop were again normal.Ultrasound showed multiple gallstones, diffuse gallbladder wall thickening but no other inflammatory changes and a negative Murphy's sign. CBD is normal.      Past history is negative for any abdominal surgery except for a laparoscopic tubal. Family history is negative for gallbladder disease or cancer.    She is brought to the operating room electively for cholecystectomy.  She states she had a little bit of pain last night but feels fine this morning   Operative Findings:       Gallbladder was chronically inflamed.  A little bit thick walled.  Whitish in color.  there was some edema of the lower gallbladder infundibulum and neck of the gallbladder.  There is no purulence or infection.  The anatomy of the cystic duct cystic artery and common bile duct were conventional.  I found a larger anterior branch of the cystic artery and a smaller posterior branch which were controlled separately.  The cystic duct is probably about 3 or 4 mm in diameter.  Since the anatomy of the cystic  duct and cystic artery were conventional, liver function tests were normal, and the common bile duct was not dilated I chose not to do a cholangiogram.  The liver appeared healthy.  Stomach, duodenum, small intestine, large intestine were grossly normal to inspection.  There is no intra-abdominal fluid.  Procedure in Detail:          Following the induction of general endotracheal anesthesia the patient's abdomen was prepped and draped in a sterile fashion.  Surgical timeout was performed.  Intravenous antibiotics were given.  0.5% Marcaine with epinephrine was used as local infiltration anesthetic.      A transverse incision was made at the lower rim of the umbilicus, through an old laparoscopic scar.  The fascia was incised in the midline and the abdominal cavity entered under direct vision.  An 11 mm Hassan trocar was inserted and secured with the Purstring suture of 0 Vicryl.  Pneumoperitoneum was created.  Video camera was inserted.  A trocar was placed in subxiphoid region and 2 trochars placed in the right upper quadrant.  The gallbladder fundus was grasped and elevated.  We will to retract the infundibulum laterally.  Using electrocautery and blunt dissection  we took down all of the peritoneum on the neck of the gallbladder exposing the infundibulum and junction with the cystic duct.  We isolated the cystic duct and the anterior branch of the cystic artery and created a large window behind these structures.  We then secured both of these structures with multiple metal clips and divided them.  We then found a small posterior branch of the cystic artery and controlled it with metal clips and divided it.  Gallbladder was dissected from his bed with electrocautery, placed in a specimen bag and removed.  The operative field was copiously irrigated.  At the completion of the case there was no bleeding or bile twice a day leak whatsoever.     The trocar was removed from the umbilicus.  With the pneumoperitoneum  maintained and under direct vision I closed the fascia at the umbilicus with a few interrupted figure-of-eight sutures of 0 Vicryl.  This closed the umbilicus nicely.  The pneumoperitoneum was then released and the rest of the trochars removed.  The skin incisions were closed with subcuticular sutures of 4-0 Monocryl and Dermabond.  The patient tolerated the procedure well was taken to PACU in stable condition.  EBL 10 mL.  Counts correct.  Complications none.     Edsel Petrin. Dalbert Batman, M.D., FACS General and Minimally Invasive Surgery Breast and Colorectal Surgery  05/04/2015 8:48 AM

## 2015-05-04 NOTE — Transfer of Care (Signed)
Immediate Anesthesia Transfer of Care Note  Patient: Katherine Warren  Procedure(s) Performed: Procedure(s): LAPAROSCOPIC CHOLECYSTECTOMY  (N/A)  Patient Location: PACU  Anesthesia Type:General  Level of Consciousness: awake, alert , oriented and sedated  Airway & Oxygen Therapy: Patient Spontanous Breathing and Patient connected to nasal cannula oxygen  Post-op Assessment: Report given to RN, Post -op Vital signs reviewed and stable and Patient moving all extremities  Post vital signs: Reviewed and stable  Last Vitals:  Filed Vitals:   05/04/15 0605  BP: 91/61  Pulse: 67  Temp: 36.8 C  Resp: 16    Complications: No apparent anesthesia complications

## 2015-05-04 NOTE — Anesthesia Preprocedure Evaluation (Signed)
Anesthesia Evaluation  Patient identified by MRN, date of birth, ID band Patient awake    Reviewed: Allergy & Precautions, NPO status , Patient's Chart, lab work & pertinent test results  Airway Mallampati: III  TM Distance: >3 FB Neck ROM: Full    Dental  (+) Caps   Pulmonary Current Smoker,    Pulmonary exam normal breath sounds clear to auscultation       Cardiovascular negative cardio ROS Normal cardiovascular exam Rhythm:Regular Rate:Normal     Neuro/Psych negative neurological ROS  negative psych ROS   GI/Hepatic Neg liver ROS, Cholelithiasis with chronic cholecystitis   Endo/Other  Obesity  Renal/GU negative Renal ROS  negative genitourinary   Musculoskeletal negative musculoskeletal ROS (+)   Abdominal (+) + obese,   Peds  Hematology negative hematology ROS (+)   Anesthesia Other Findings   Reproductive/Obstetrics negative OB ROS                             Anesthesia Physical Anesthesia Plan  ASA: II  Anesthesia Plan: General   Post-op Pain Management:    Induction: Intravenous  Airway Management Planned: Oral ETT  Additional Equipment:   Intra-op Plan:   Post-operative Plan: Extubation in OR  Informed Consent: I have reviewed the patients History and Physical, chart, labs and discussed the procedure including the risks, benefits and alternatives for the proposed anesthesia with the patient or authorized representative who has indicated his/her understanding and acceptance.   Dental advisory given  Plan Discussed with: CRNA, Anesthesiologist and Surgeon  Anesthesia Plan Comments:         Anesthesia Quick Evaluation

## 2015-05-04 NOTE — Discharge Instructions (Signed)
[facility] ______[facility] °LAPAROSCOPIC SURGERY: POST OP INSTRUCTIONS °Always review your discharge instruction sheet given to you by the facility where your surgery was performed. °IF YOU HAVE DISABILITY OR FAMILY LEAVE FORMS, YOU MUST BRING THEM TO THE OFFICE FOR PROCESSING.   °DO NOT GIVE THEM TO YOUR DOCTOR. ° °1. A prescription for pain medication may be given to you upon discharge.  Take your pain medication as prescribed, if needed.  If narcotic pain medicine is not needed, then you may take acetaminophen (Tylenol) or ibuprofen (Advil) as needed. °2. Take your usually prescribed medications unless otherwise directed. °3. If you need a refill on your pain medication, please contact your pharmacy.  They will contact our office to request authorization. Prescriptions will not be filled after 5pm or on week-ends. °4. You should follow a light diet the first few days after arrival home, such as soup and crackers, etc.  Be sure to include lots of fluids daily. °5. Most patients will experience some swelling and bruising in the area of the incisions.  Ice packs will help.  Swelling and bruising can take several days to resolve.  °6. It is common to experience some constipation if taking pain medication after surgery.  Increasing fluid intake and taking a stool softener (such as Colace) will usually help or prevent this problem from occurring.  A mild laxative (Milk of Magnesia or Miralax) should be taken according to package instructions if there are no bowel movements after 48 hours. °7. Unless discharge instructions indicate otherwise, you may remove your bandages 24-48 hours after surgery, and you may shower at that time.  You may have steri-strips (small skin tapes) in place directly over the incision.  These strips should be left on the skin for 7-10 days.  If your surgeon used skin glue on the incision, you may shower in 24 hours.  The glue will flake off over the next 2-3 weeks.  Any sutures or  staples will be removed at the office during your follow-up visit. °8. ACTIVITIES:  You may resume regular (light) daily activities beginning the next day--such as daily self-care, walking, climbing stairs--gradually increasing activities as tolerated.  You may have sexual intercourse when it is comfortable.  Refrain from any heavy lifting or straining until approved by your doctor. °a. You may drive when you are no longer taking prescription pain medication, you can comfortably wear a seatbelt, and you can safely maneuver your car and apply brakes. °b. RETURN TO WORK:  __________________________________________________________ °9. You should see your doctor in the office for a follow-up appointment approximately 2-3 weeks after your surgery.  Make sure that you call for this appointment within a day or two after you arrive home to insure a convenient appointment time. °10. OTHER INSTRUCTIONS: __________________________________________________________________________________________________________________________ __________________________________________________________________________________________________________________________ °WHEN TO CALL YOUR DOCTOR: °1. Fever over 101.0 °2. Inability to urinate °3. Continued bleeding from incision. °4. Increased pain, redness, or drainage from the incision. °5. Increasing abdominal pain ° °The clinic staff is available to answer your questions during regular business hours.  Please don’t hesitate to call and ask to speak to one of the nurses for clinical concerns.  If you have a medical emergency, go to the nearest emergency room or call 911.  A surgeon from [facility] is always on call at the hospital. °[address], [address], [place], [place]  [address] ? [address] [address], [place], [place]   [address] °[phone] ? [phone] ? FAX [phone] °Web site:   [email or web address] °

## 2015-05-04 NOTE — Progress Notes (Signed)
Preop interview completed with assistance Katherine Warren interpreter from [facility]

## 2015-05-05 ENCOUNTER — Encounter (HOSPITAL_COMMUNITY): Payer: Self-pay | Admitting: General Surgery

## 2016-04-10 IMAGING — US US ABDOMEN COMPLETE
1 series · 13 of 25 positions shown · non-contrast
Comparison: None.

CLINICAL DATA: 54-year-old female with right abdominal and flank
pain

EXAM:
ABDOMEN ULTRASOUND COMPLETE

[Series 1: us abdomen complete · 0.22mm/px · 13 of 109 slices shown]
[im 1/109]
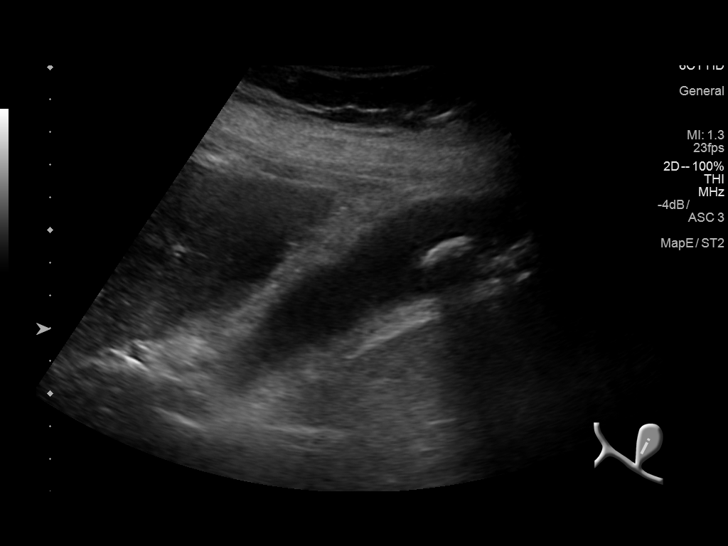
[im 10/109]
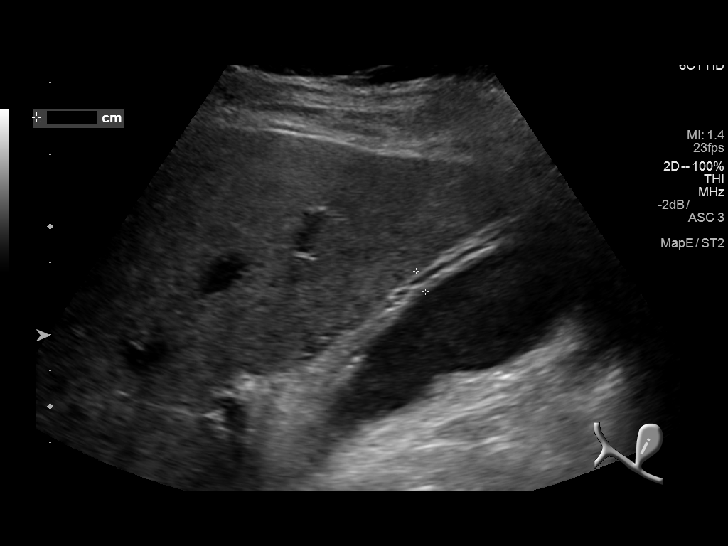
[im 19/109]
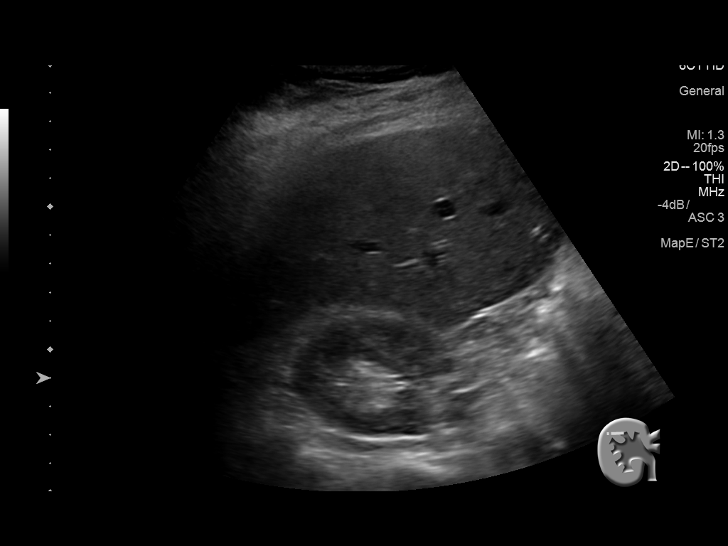
[im 28/109]
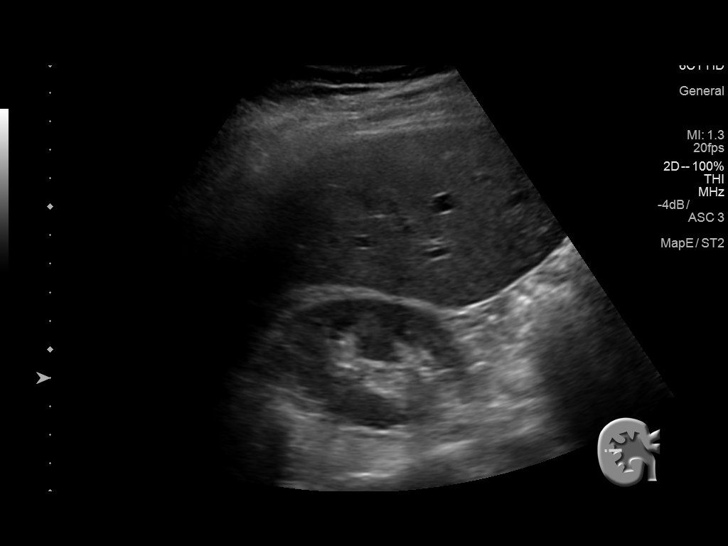
[im 37/109]
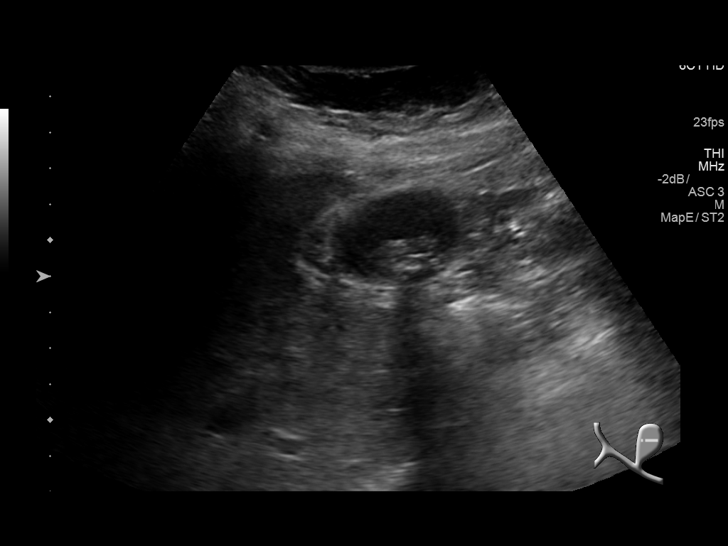
[im 46/109]
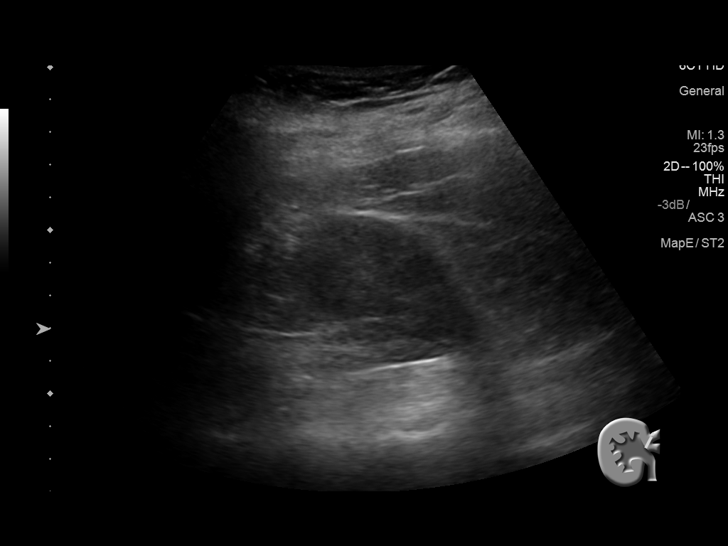
[im 55/109]
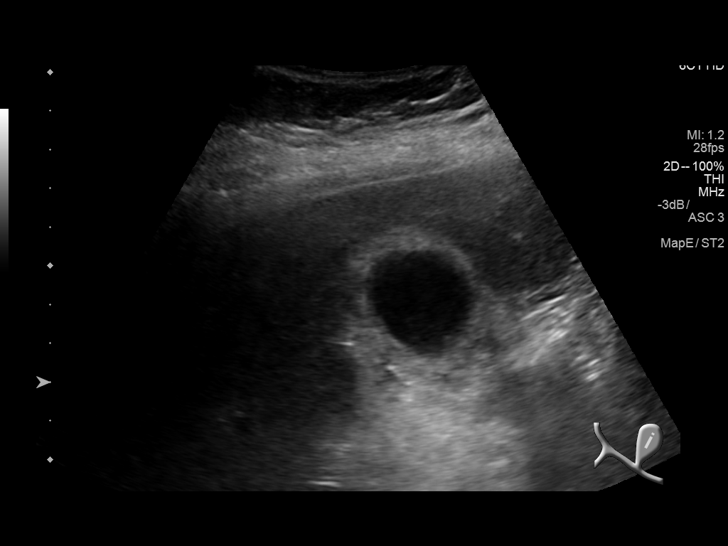
[im 64/109]
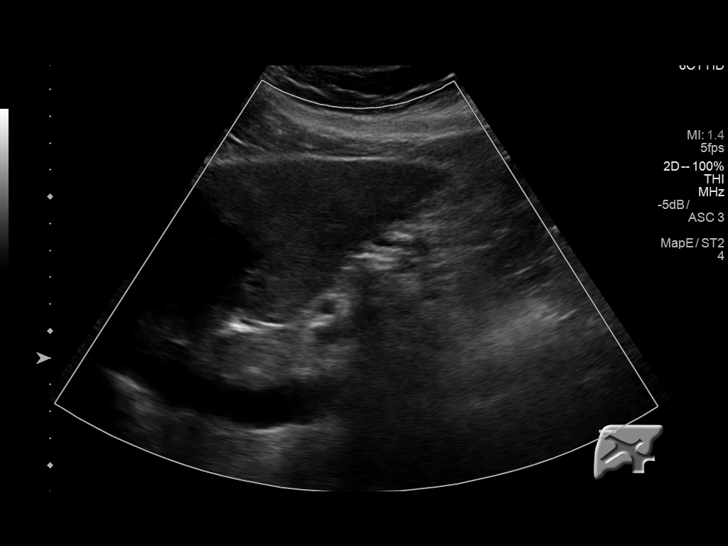
[im 73/109]
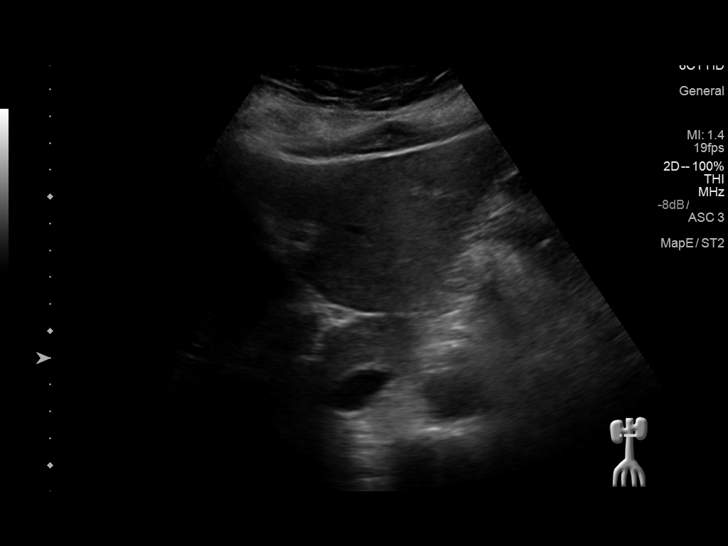
[im 82/109]
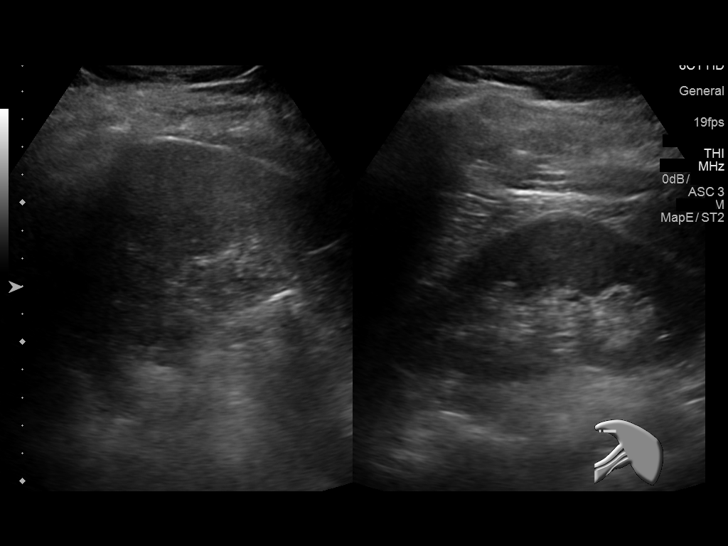
[im 91/109]
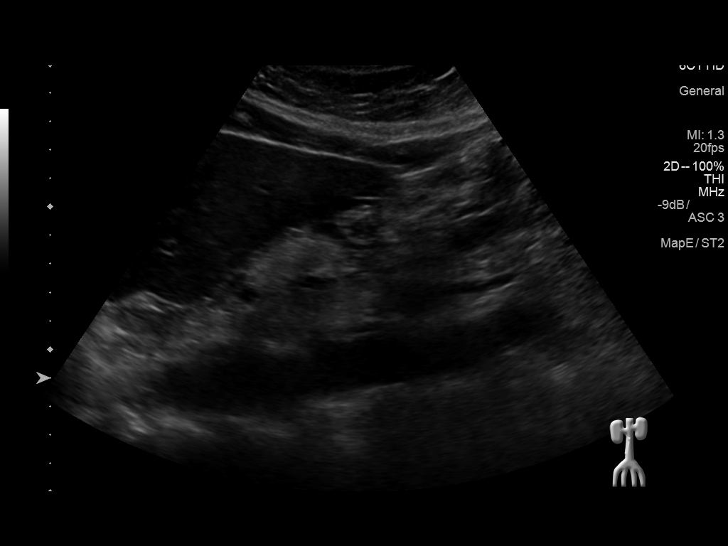
[im 100/109]
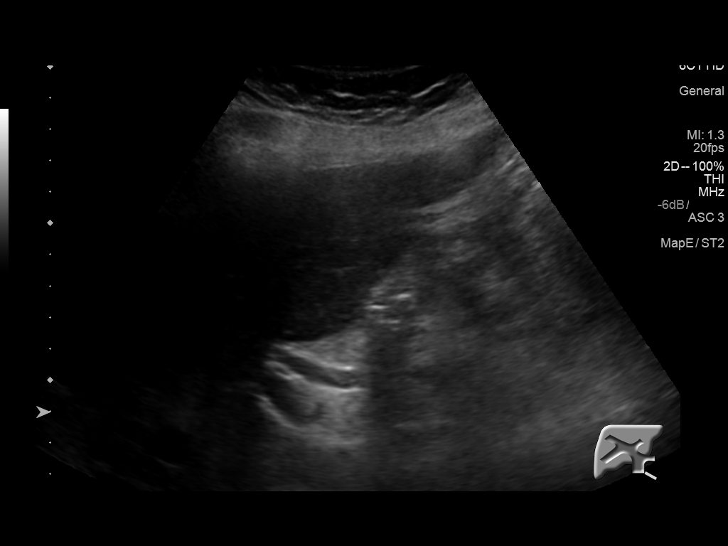
[im 109/109]
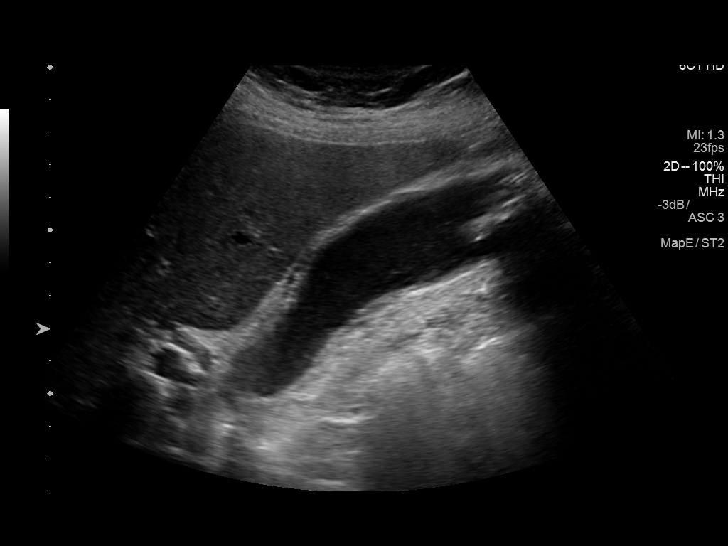

[13 of 25 positions shown; findings below may reference images not displayed]

FINDINGS: Gallbladder: Echogenic mobile foci with posterior acoustic shadowing
consistent with cholelithiasis. The gallbladder wall is thickened at
6 mm. Per the sonographer, there is no sonographic Murphy sign.

Common bile duct: Diameter: Within normal limits at 5 mm

Liver: No focal lesion identified. Within normal limits in
parenchymal echogenicity.

IVC: No abnormality visualized.

Pancreas: Visualized portion unremarkable.

Spleen: Size and appearance within normal limits.

Right Kidney: Length: 10.3 cm. Echogenicity within normal limits. No
mass or hydronephrosis visualized.

Left Kidney: Length: 11.2 cm. Echogenicity within normal limits. No
mass or hydronephrosis visualized.

Abdominal aorta: No aneurysm visualized.

Other findings: None.
IMPRESSION: 1. Cholelithiasis with diffuse gallbladder wall thickening. However,
there is no pericholecystic fluid and per the sonographer, the
sonographic Murphy sign was negative. Sonographic findings are
therefore indeterminate but can be seen in the setting of both
chronic and acute cholecystitis. If further imaging is clinically
warranted, consider nuclear medicine HIDA scan.
2. Normal caliber common bile ducts.

## 2020-01-16 ENCOUNTER — Other Ambulatory Visit: Payer: Self-pay | Admitting: Orthopedic Surgery

## 2022-04-06 DEATH — deceased
